# Patient Record
Sex: Female | Born: 1960 | Race: White | Hispanic: No | Smoking: Former smoker
Health system: Southern US, Community
[De-identification: ages and names within clinical notes are randomized; demographics above are authoritative.]

## PROBLEM LIST (undated history)

## (undated) DIAGNOSIS — J45909 Unspecified asthma, uncomplicated: Secondary | ICD-10-CM

## (undated) DIAGNOSIS — G8929 Other chronic pain: Secondary | ICD-10-CM

## (undated) DIAGNOSIS — G4730 Sleep apnea, unspecified: Secondary | ICD-10-CM

## (undated) DIAGNOSIS — I10 Essential (primary) hypertension: Secondary | ICD-10-CM

## (undated) DIAGNOSIS — E041 Nontoxic single thyroid nodule: Secondary | ICD-10-CM

## (undated) DIAGNOSIS — J449 Chronic obstructive pulmonary disease, unspecified: Secondary | ICD-10-CM

## (undated) DIAGNOSIS — M549 Dorsalgia, unspecified: Secondary | ICD-10-CM

## (undated) HISTORY — PX: TONSILLECTOMY: SUR1361

## (undated) HISTORY — PX: ABDOMINAL HYSTERECTOMY: SHX81

## (undated) HISTORY — PX: APPENDECTOMY: SHX54

---

## 2012-10-20 ENCOUNTER — Emergency Department (HOSPITAL_BASED_OUTPATIENT_CLINIC_OR_DEPARTMENT_OTHER)
Admission: EM | Admit: 2012-10-20 | Discharge: 2012-10-20 | Disposition: A | Discharge status: Home / Self Care | Payer: Medicare Other | Attending: Emergency Medicine | Admitting: Emergency Medicine

## 2012-10-20 ENCOUNTER — Encounter (HOSPITAL_BASED_OUTPATIENT_CLINIC_OR_DEPARTMENT_OTHER): Payer: Self-pay | Admitting: *Deleted

## 2012-10-20 DIAGNOSIS — K047 Periapical abscess without sinus: Secondary | ICD-10-CM

## 2012-10-20 DIAGNOSIS — Z8639 Personal history of other endocrine, nutritional and metabolic disease: Secondary | ICD-10-CM | POA: Insufficient documentation

## 2012-10-20 DIAGNOSIS — Z79899 Other long term (current) drug therapy: Secondary | ICD-10-CM | POA: Insufficient documentation

## 2012-10-20 DIAGNOSIS — K044 Acute apical periodontitis of pulpal origin: Secondary | ICD-10-CM | POA: Insufficient documentation

## 2012-10-20 DIAGNOSIS — Z862 Personal history of diseases of the blood and blood-forming organs and certain disorders involving the immune mechanism: Secondary | ICD-10-CM | POA: Insufficient documentation

## 2012-10-20 DIAGNOSIS — J449 Chronic obstructive pulmonary disease, unspecified: Secondary | ICD-10-CM | POA: Insufficient documentation

## 2012-10-20 DIAGNOSIS — Z87891 Personal history of nicotine dependence: Secondary | ICD-10-CM | POA: Insufficient documentation

## 2012-10-20 DIAGNOSIS — I10 Essential (primary) hypertension: Secondary | ICD-10-CM | POA: Insufficient documentation

## 2012-10-20 DIAGNOSIS — J4489 Other specified chronic obstructive pulmonary disease: Secondary | ICD-10-CM | POA: Insufficient documentation

## 2012-10-20 DIAGNOSIS — Z8739 Personal history of other diseases of the musculoskeletal system and connective tissue: Secondary | ICD-10-CM | POA: Insufficient documentation

## 2012-10-20 HISTORY — DX: Essential (primary) hypertension: I10

## 2012-10-20 HISTORY — DX: Dorsalgia, unspecified: M54.9

## 2012-10-20 HISTORY — DX: Chronic obstructive pulmonary disease, unspecified: J44.9

## 2012-10-20 HISTORY — DX: Unspecified asthma, uncomplicated: J45.909

## 2012-10-20 HISTORY — DX: Nontoxic single thyroid nodule: E04.1

## 2012-10-20 HISTORY — DX: Other chronic pain: G89.29

## 2012-10-20 MED ORDER — HYDROCODONE-ACETAMINOPHEN 5-325 MG PO TABS
1.0000 | ORAL_TABLET | Freq: Once | ORAL | Status: AC
  Administered 2012-10-20: 1 via ORAL
  Filled 2012-10-20: qty 1

## 2012-10-20 MED ORDER — IBUPROFEN 200 MG PO TABS
600.0000 mg | ORAL_TABLET | Freq: Once | ORAL | Status: AC
  Administered 2012-10-20: 600 mg via ORAL
  Filled 2012-10-20: qty 1

## 2012-10-20 MED ORDER — DIPHENHYDRAMINE HCL 25 MG PO CAPS: 25.0000 mg | ORAL_CAPSULE | Freq: Four times a day (QID) | ORAL | Status: AC | PRN

## 2012-10-20 MED ORDER — AMOXICILLIN-POT CLAVULANATE 875-125 MG PO TABS: 1.0000 | ORAL_TABLET | Freq: Two times a day (BID) | ORAL | Status: AC

## 2012-10-20 MED ORDER — IBUPROFEN 400 MG PO TABS: 400.0000 mg | ORAL_TABLET | Freq: Four times a day (QID) | ORAL | Status: AC | PRN

## 2012-10-20 MED ORDER — AMOXICILLIN-POT CLAVULANATE 875-125 MG PO TABS
1.0000 | ORAL_TABLET | Freq: Once | ORAL | Status: AC
  Administered 2012-10-20: 1 via ORAL
  Filled 2012-10-20: qty 1

## 2012-10-20 MED ORDER — HYDROCODONE-ACETAMINOPHEN 5-325 MG PO TABS: 1.0000 | ORAL_TABLET | Freq: Four times a day (QID) | ORAL | Status: AC | PRN

## 2012-10-20 NOTE — ED Provider Notes (Signed)
History    This chart was scribed for Carol Kaplan, MD by Donne Anon, ED Scribe. This patient was seen in room MH08/MH08 and the patient's care was started at 1541.   CSN: [id number]  Arrival date & time 10/20/12  1450   First MD Initiated Contact with Patient 10/20/12 1541      Chief Complaint  Patient presents with  . Dental Pain     The history is provided by the patient. No language interpreter was used.   Carol Herrera is a 52 y.o. female who presents to the Emergency Department complaining of gradual onset, non-changing, constant top right gum pain which radiates to her cheeks which began when she woke up this morning. Pt wears dentures and she has not since removed them. She reports associated difficulty chewing. She denies sore throat, nausea, vomiting, fever, chills, recent trauma or any other pain. She denies any h/o dental infections or previous episodes. She has not tried any OTC pain medication.She has a h/o COPD, HTN, asthma, and thyroid cyst.  She does not currently have a dentist and sees her PCP for her denture care. She has had dentures since age 49.   Past Medical History  Diagnosis Date  . COPD (chronic obstructive pulmonary disease)   . Hypertension   . Asthma   . Thyroid cyst   . Back pain, chronic     Past Surgical History  Procedure Laterality Date  . Appendectomy    . Tonsillectomy    . Abdominal hysterectomy      No family history on file.  History  Substance Use Topics  . Smoking status: Former Games developer  . Smokeless tobacco: Never Used  . Alcohol Use: No    Review of Systems  Constitutional: Negative for chills.  HENT: Positive for dental problem.   Gastrointestinal: Negative for nausea and vomiting.  All other systems reviewed and are negative.    Allergies  Biaxin; Stadol; Penicillins; and Sulfa antibiotics  Home Medications   Current Outpatient Rx  Name  Route  Sig  Dispense  Refill  . albuterol (PROVENTIL HFA;VENTOLIN HFA) 108  (90 BASE) MCG/ACT inhaler   Inhalation   Inhale 2 puffs into the lungs 2 (two) times daily.         Marland Kitchen albuterol-ipratropium (COMBIVENT) 18-103 MCG/ACT inhaler   Inhalation   Inhale 2 puffs into the lungs 2 (two) times daily.         Marland Kitchen amLODipine (NORVASC) 10 MG tablet   Oral   Take 10 mg by mouth daily.         Marland Kitchen ipratropium-albuterol (DUONEB) 0.5-2.5 (3) MG/3ML SOLN   Nebulization   Take 3 mLs by nebulization every 4 (four) hours as needed.         Marland Kitchen omeprazole (PRILOSEC) 40 MG capsule   Oral   Take 40 mg by mouth daily.         . sertraline (ZOLOFT) 100 MG tablet   Oral   Take 50 mg by mouth daily.         . zafirlukast (ACCOLATE) 20 MG tablet   Oral   Take 20 mg by mouth 2 (two) times daily.           BP 107/70  Pulse 101  Temp(Src) 97.9 F (36.6 C) (Oral)  Resp 20  Ht 5\' 4"  (1.626 m)  Wt 260 lb (117.935 kg)  BMI 44.61 kg/m2  SpO2 93%  Physical Exam  Nursing note and vitals reviewed. Constitutional: She  is oriented to person, place, and time. She appears well-developed and well-nourished. No distress.  HENT:  Head: Normocephalic and atraumatic.  Mouth/Throat: No oropharyngeal exudate or posterior oropharyngeal erythema.  Mucosa is clean. Tenderness to palpation from tooth 1-8. Tenderness is worse to tooth 5-8. No lymphadenopathy. Tenderness is reproducible with pappatation of dentures. Dentures are planted for teeth 1-16. Between tooth 5-7 there is what appears to be a cyst, no erythema appreciated. No fluctuance appreciated.  Eyes: EOM are normal.  Neck: Neck supple. No tracheal deviation present.  Cardiovascular: Normal rate, regular rhythm and normal heart sounds.   Pulmonary/Chest: Effort normal and breath sounds normal. No respiratory distress.  Musculoskeletal: Normal range of motion.  Neurological: She is alert and oriented to person, place, and time.  Skin: Skin is warm and dry.  Psychiatric: She has a normal mood and affect. Her behavior  is normal.    ED Course  Procedures (including critical care time) DIAGNOSTIC STUDIES: Oxygen Saturation is 93% on room air, low by my interpretation.    COORDINATION OF CARE: 4:18 PM Discussed treatment plan which includes pain medication, antibiotics and a follow up with a dentist with pt at bedside and pt agreed to plan.     Labs Reviewed - No data to display No results found.   No diagnosis found.    MDM  I personally performed the services described in this documentation, which was scribed in my presence. The recorded information has been reviewed and is accurate.  Pt comes in with cc of tooth pain. She has a specific area - between teeth 5-7 - where there is a cystic lesion, and it is very tender. The lesion is not new. There is no fluctuance. No n/v/f/c. No trismus.  No visible abscess.  I have requested patient to take her dentures out. She is to see a dentist soon - and i have explained to her the importance of close followup. AB to be started in the ED.       Carol Kaplan, MD 10/20/12 [phone]

## 2012-10-20 NOTE — ED Notes (Signed)
Mouth swollen x 1 day- states woke with symptoms

## 2018-07-26 ENCOUNTER — Emergency Department (HOSPITAL_BASED_OUTPATIENT_CLINIC_OR_DEPARTMENT_OTHER)
Admission: EM | Admit: 2018-07-26 | Discharge: 2018-07-26 | Disposition: A | Discharge status: Other Acute Inpatient Hospital | Payer: Medicare HMO | Attending: Emergency Medicine | Admitting: Emergency Medicine

## 2018-07-26 ENCOUNTER — Emergency Department (HOSPITAL_BASED_OUTPATIENT_CLINIC_OR_DEPARTMENT_OTHER): Payer: Medicare HMO

## 2018-07-26 ENCOUNTER — Encounter (HOSPITAL_BASED_OUTPATIENT_CLINIC_OR_DEPARTMENT_OTHER): Payer: Self-pay | Admitting: Emergency Medicine

## 2018-07-26 ENCOUNTER — Other Ambulatory Visit: Payer: Self-pay

## 2018-07-26 DIAGNOSIS — Z87891 Personal history of nicotine dependence: Secondary | ICD-10-CM | POA: Diagnosis not present

## 2018-07-26 DIAGNOSIS — R05 Cough: Secondary | ICD-10-CM | POA: Diagnosis present

## 2018-07-26 DIAGNOSIS — Z79899 Other long term (current) drug therapy: Secondary | ICD-10-CM | POA: Diagnosis not present

## 2018-07-26 DIAGNOSIS — J189 Pneumonia, unspecified organism: Secondary | ICD-10-CM | POA: Diagnosis not present

## 2018-07-26 DIAGNOSIS — R0789 Other chest pain: Secondary | ICD-10-CM | POA: Insufficient documentation

## 2018-07-26 DIAGNOSIS — J441 Chronic obstructive pulmonary disease with (acute) exacerbation: Secondary | ICD-10-CM | POA: Diagnosis not present

## 2018-07-26 DIAGNOSIS — J45909 Unspecified asthma, uncomplicated: Secondary | ICD-10-CM | POA: Diagnosis not present

## 2018-07-26 DIAGNOSIS — J181 Lobar pneumonia, unspecified organism: Secondary | ICD-10-CM

## 2018-07-26 DIAGNOSIS — I10 Essential (primary) hypertension: Secondary | ICD-10-CM | POA: Insufficient documentation

## 2018-07-26 HISTORY — DX: Sleep apnea, unspecified: G47.30

## 2018-07-26 LAB — CBC WITH DIFFERENTIAL/PLATELET
Abs Immature Granulocytes: 0.02 10*3/uL (ref 0.00–0.07)
Basophils Absolute: 0.1 10*3/uL (ref 0.0–0.1)
Basophils Relative: 1 %
EOS PCT: 3 %
Eosinophils Absolute: 0.2 10*3/uL (ref 0.0–0.5)
HEMATOCRIT: 33.6 % — AB (ref 36.0–46.0)
HEMOGLOBIN: 9.6 g/dL — AB (ref 12.0–15.0)
Immature Granulocytes: 0 %
LYMPHS ABS: 2.8 10*3/uL (ref 0.7–4.0)
LYMPHS PCT: 35 %
MCH: 20.7 pg — AB (ref 26.0–34.0)
MCHC: 28.6 g/dL — AB (ref 30.0–36.0)
MCV: 72.4 fL — ABNORMAL LOW (ref 80.0–100.0)
Monocytes Absolute: 0.9 10*3/uL (ref 0.1–1.0)
Monocytes Relative: 11 %
Neutro Abs: 4.2 10*3/uL (ref 1.7–7.7)
Neutrophils Relative %: 50 %
Platelets: 220 10*3/uL (ref 150–400)
RBC: 4.64 MIL/uL (ref 3.87–5.11)
RDW: 17.3 % — ABNORMAL HIGH (ref 11.5–15.5)
WBC: 8.2 10*3/uL (ref 4.0–10.5)
nRBC: 0 % (ref 0.0–0.2)

## 2018-07-26 LAB — BASIC METABOLIC PANEL
Anion gap: 8 (ref 5–15)
BUN: 15 mg/dL (ref 6–20)
CHLORIDE: 104 mmol/L (ref 98–111)
CO2: 24 mmol/L (ref 22–32)
CREATININE: 1.08 mg/dL — AB (ref 0.44–1.00)
Calcium: 8.9 mg/dL (ref 8.9–10.3)
GFR calc Af Amer: >60 mL/min (ref >60)
GFR calc non Af Amer: 57 mL/min — ABNORMAL LOW (ref >60)
GLUCOSE: 91 mg/dL (ref 70–99)
Potassium: 3.5 mmol/L (ref 3.5–5.1)
Sodium: 136 mmol/L (ref 135–145)

## 2018-07-26 LAB — TROPONIN I

## 2018-07-26 MED ORDER — ALBUTEROL SULFATE (2.5 MG/3ML) 0.083% IN NEBU
5.0000 mg | INHALATION_SOLUTION | Freq: Once | RESPIRATORY_TRACT | Status: AC
  Administered 2018-07-26: 5 mg via RESPIRATORY_TRACT
  Filled 2018-07-26: qty 6

## 2018-07-26 MED ORDER — LEVOFLOXACIN IN D5W 750 MG/150ML IV SOLN
750.0000 mg | Freq: Once | INTRAVENOUS | Status: AC
Start: 2018-07-26 — End: 2018-07-26
  Administered 2018-07-26: 750 mg via INTRAVENOUS
  Filled 2018-07-26: qty 150

## 2018-07-26 MED ORDER — METHYLPREDNISOLONE SODIUM SUCC 125 MG IJ SOLR
125.0000 mg | Freq: Once | INTRAMUSCULAR | Status: AC
  Administered 2018-07-26: 125 mg via INTRAVENOUS
  Filled 2018-07-26: qty 2

## 2018-07-26 MED ORDER — IPRATROPIUM BROMIDE 0.02 % IN SOLN
0.5000 mg | Freq: Once | RESPIRATORY_TRACT | Status: AC
  Administered 2018-07-26: 0.5 mg via RESPIRATORY_TRACT
  Filled 2018-07-26: qty 2.5

## 2018-07-26 NOTE — ED Notes (Signed)
Pt and family made aware that pt has been accepted at [facility]. Awaiting bed assignment at this time.

## 2018-07-26 NOTE — ED Notes (Signed)
Bedside commode provided for patient at this time

## 2018-07-26 NOTE — ED Notes (Signed)
Transport en route to pick up patient

## 2018-07-26 NOTE — ED Notes (Signed)
[facility] here with patient at this time. Leaving with patient

## 2018-07-26 NOTE — ED Provider Notes (Signed)
TIME SEEN: 12:51 AM  CHIEF COMPLAINT: Cough, shortness of breath  HPI: Patient is a 57 year old female with history of hypertension, COPD on home oxygen at night who presents to the emergency department with 2 days of cough with yellow sputum production, shortness of breath.  Just recently admitted to the hospital for multifocal pneumonia.  She denies any fever.  Having some chest discomfort with coughing.  She is concerned that she could have pneumonia again.  ROS: See HPI Constitutional: no fever  Eyes: no drainage  ENT: no runny nose   Cardiovascular:  chest pain  Resp:  SOB  GI: no vomiting GU: no dysuria Integumentary: no rash  Allergy: no hives  Musculoskeletal: no leg swelling  Neurological: no slurred speech ROS otherwise negative  PAST MEDICAL HISTORY/PAST SURGICAL HISTORY:  Past Medical History:  Diagnosis Date  . Asthma   . Back pain, chronic   . COPD (chronic obstructive pulmonary disease) (HCC)   . Hypertension   . Thyroid cyst     MEDICATIONS:  Prior to Admission medications   Medication Sig Start Date End Date Taking? Authorizing Provider  albuterol (PROVENTIL HFA;VENTOLIN HFA) 108 (90 BASE) MCG/ACT inhaler Inhale 2 puffs into the lungs 2 (two) times daily.    [provider]  albuterol-ipratropium (COMBIVENT) 18-103 MCG/ACT inhaler Inhale 2 puffs into the lungs 2 (two) times daily.    [provider]  amLODipine (NORVASC) 10 MG tablet Take 10 mg by mouth daily.    [provider]  amoxicillin-clavulanate (AUGMENTIN) 875-125 MG per tablet Take 1 tablet by mouth 2 (two) times daily. 10/21/12   [name], MD  diphenhydrAMINE (BENADRYL) 25 mg capsule Take 1 capsule (25 mg total) by mouth every 6 (six) hours as needed for itching. 10/20/12   [name], MD  HYDROcodone-acetaminophen (NORCO/VICODIN) 5-325 MG per tablet Take 1 tablet by mouth every 6 (six) hours as needed for pain. 10/20/12   [name], MD  ibuprofen  (ADVIL,MOTRIN) 400 MG tablet Take 1 tablet (400 mg total) by mouth every 6 (six) hours as needed for pain. 10/20/12   [name], MD  ipratropium-albuterol (DUONEB) 0.5-2.5 (3) MG/3ML SOLN Take 3 mLs by nebulization every 4 (four) hours as needed.    [provider]  omeprazole (PRILOSEC) 40 MG capsule Take 40 mg by mouth daily.    [provider]  sertraline (ZOLOFT) 100 MG tablet Take 50 mg by mouth daily.    [provider]  zafirlukast (ACCOLATE) 20 MG tablet Take 20 mg by mouth 2 (two) times daily.    [provider]    ALLERGIES:  Allergies  Allergen Reactions  . Biaxin [Clarithromycin] Other (See Comments)    "made me real tired"  . Stadol [Butorphanol] Other (See Comments)    "heart slows down"  . Penicillins Rash  . Sulfa Antibiotics Rash    SOCIAL HISTORY:  Social History   Tobacco Use  . Smoking status: Former Games developer  . Smokeless tobacco: Never Used  Substance Use Topics  . Alcohol use: No    FAMILY HISTORY: No family history on file.  EXAM: BP (!) 152/85 (BP Location: Right Arm)   Pulse 83   Temp 98.2 F (36.8 C) (Oral)   Resp (!) 21   Ht 5\' 2"  (1.575 m)   Wt 113.4 kg   SpO2 95%   BMI 45.73 kg/m  CONSTITUTIONAL: Alert and oriented and responds appropriately to questions.  Chronically ill-appearing, obese HEAD: Normocephalic EYES: Conjunctivae clear, pupils appear  equal, EOMI ENT: normal nose; moist mucous membranes NECK: Supple, no meningismus, no nuchal rigidity, no LAD  CARD: RRR; S1 and S2 appreciated; no murmurs, no clicks, no rubs, no gallops RESP: Diminished aeration at bases bilaterally, scattered inspiratory and expiratory wheezes, no rhonchi or rales, no hypoxia, no increased work of breathing, speaking full sentences, no distress ABD/GI: Normal bowel sounds; non-distended; soft, non-tender, no rebound, no guarding, no peritoneal signs, no hepatosplenomegaly BACK:  The back appears normal and is non-tender  to palpation, there is no CVA tenderness EXT: Normal ROM in all joints; non-tender to palpation; no edema; normal capillary refill; no cyanosis, no calf tenderness or swelling    SKIN: Normal color for age and race; warm; no rash NEURO: Moves all extremities equally PSYCH: The patient's mood and manner are appropriate. Grooming and personal hygiene are appropriate.  MEDICAL DECISION MAKING: Patient here with chest congestion, chest discomfort, shortness of breath over the past couple of days.  She is concerned that she has pneumonia again.  She does appear to be having a COPD exacerbation currently.  Will give albuterol, Atrovent, Solu-Medrol.  Chest x-Ferrera pending.  EKG shows right bundle branch block.  ED PROGRESS: Chest x-[place] concerning for right mid/upper lung pneumonia.  Will give IV Levaquin.  Will obtain labs, cultures.  Patient states that every time she has pneumonia she is admitted to the hospital and she does not feel comfortable with a plan for discharge home on oral antibiotics.  She would like to be admitted to [facility].  Her PCP is with [facility].  2:11 AM  D/w Dr. Clint GuyHower, hospitalist at [facility].  He agrees to accept patient to an observation bed.  She will be transferred once bed available.  Patient hemodynamically stable.   I reviewed all nursing notes, vitals, pertinent previous records, EKGs, lab and urine results, imaging (as available).    EKG Interpretation  Date/Time:  Saturday July 26 2018 00:53:37 EST Ventricular Rate:  94 PR Interval:    QRS Duration: 129 QT Interval:  365 QTC Calculation: 457 R Axis:   78 Text Interpretation:  Sinus rhythm Right bundle branch block No old tracing to compare Confirmed by Devine Klingel, Baxter HireKristen [phone]([id number]) on 07/26/2018 1:05:49 AM         Tomaz Janis, Layla MawKristen N, DO 07/26/18 [id number]

## 2018-07-26 NOTE — Progress Notes (Signed)
Placed patient on a 3 liter nasal cannula per her home regimen.  Patient stated she wears a 3 liters of O2 at night at home.  RT will continue to monitor.

## 2018-07-26 NOTE — ED Triage Notes (Signed)
Pt c/o SOB for the past 2 days, states she has hx of COPD and was admitted no long ago for PNA.

## 2018-07-26 NOTE — ED Notes (Signed)
Consult made to hospitalist for [facility]

## 2018-07-26 NOTE — ED Notes (Signed)
Report called Rene Kocheregina, Charity fundraiserN at [facility]

## 2018-07-31 LAB — CULTURE, BLOOD (ROUTINE X 2)
CULTURE: NO GROWTH
Culture: NO GROWTH
Special Requests: ADEQUATE

## 2019-11-28 IMAGING — CR DG CHEST 2V
2 series · 2 of 2 positions shown · non-contrast
Comparison: Radiograph 05/09/2018 at [REDACTED]. Chest CT
04/04/2018

CLINICAL DATA: Shortness of breath for 2 days.

EXAM:
CHEST - 2 VIEW

[w chest pa]
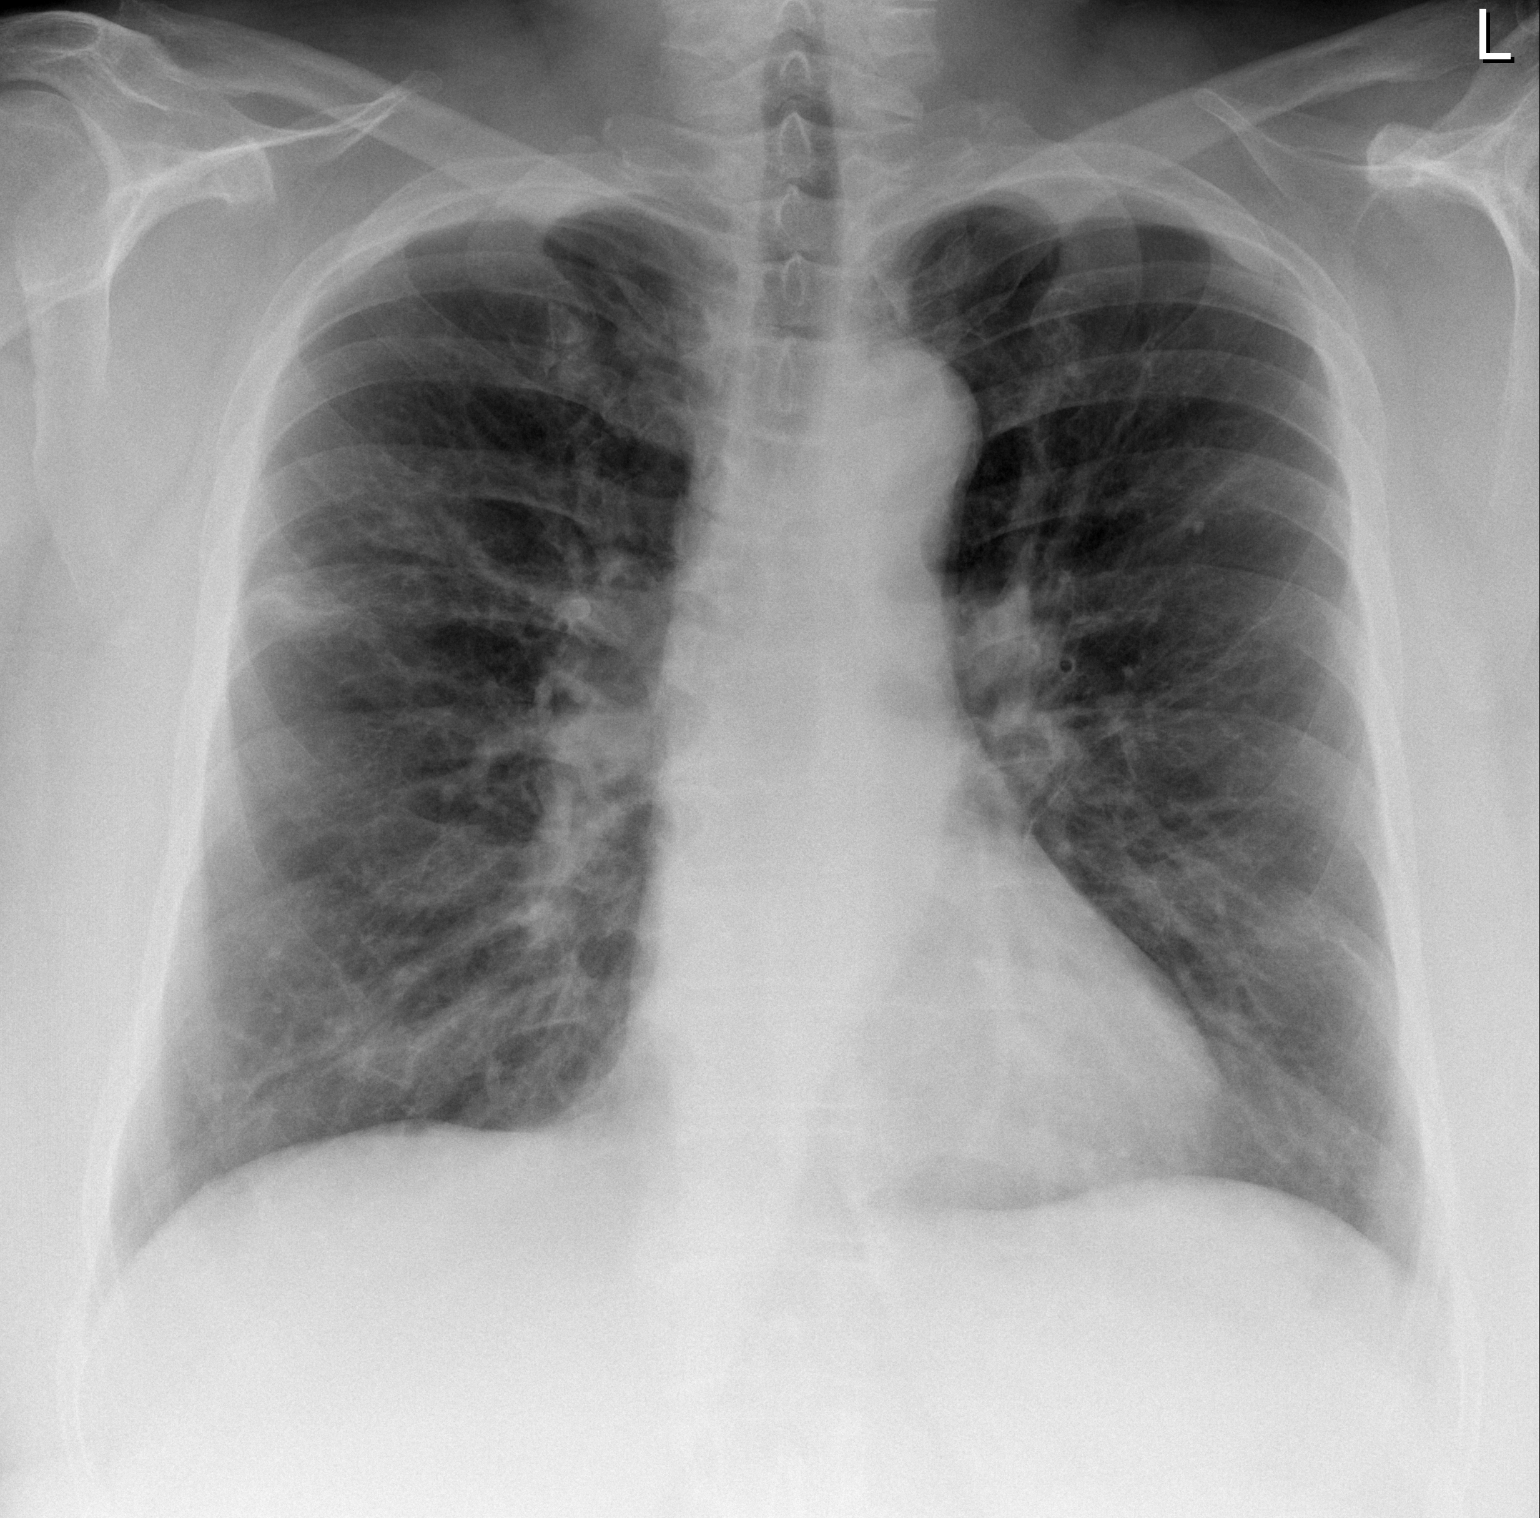

[w chest lat]
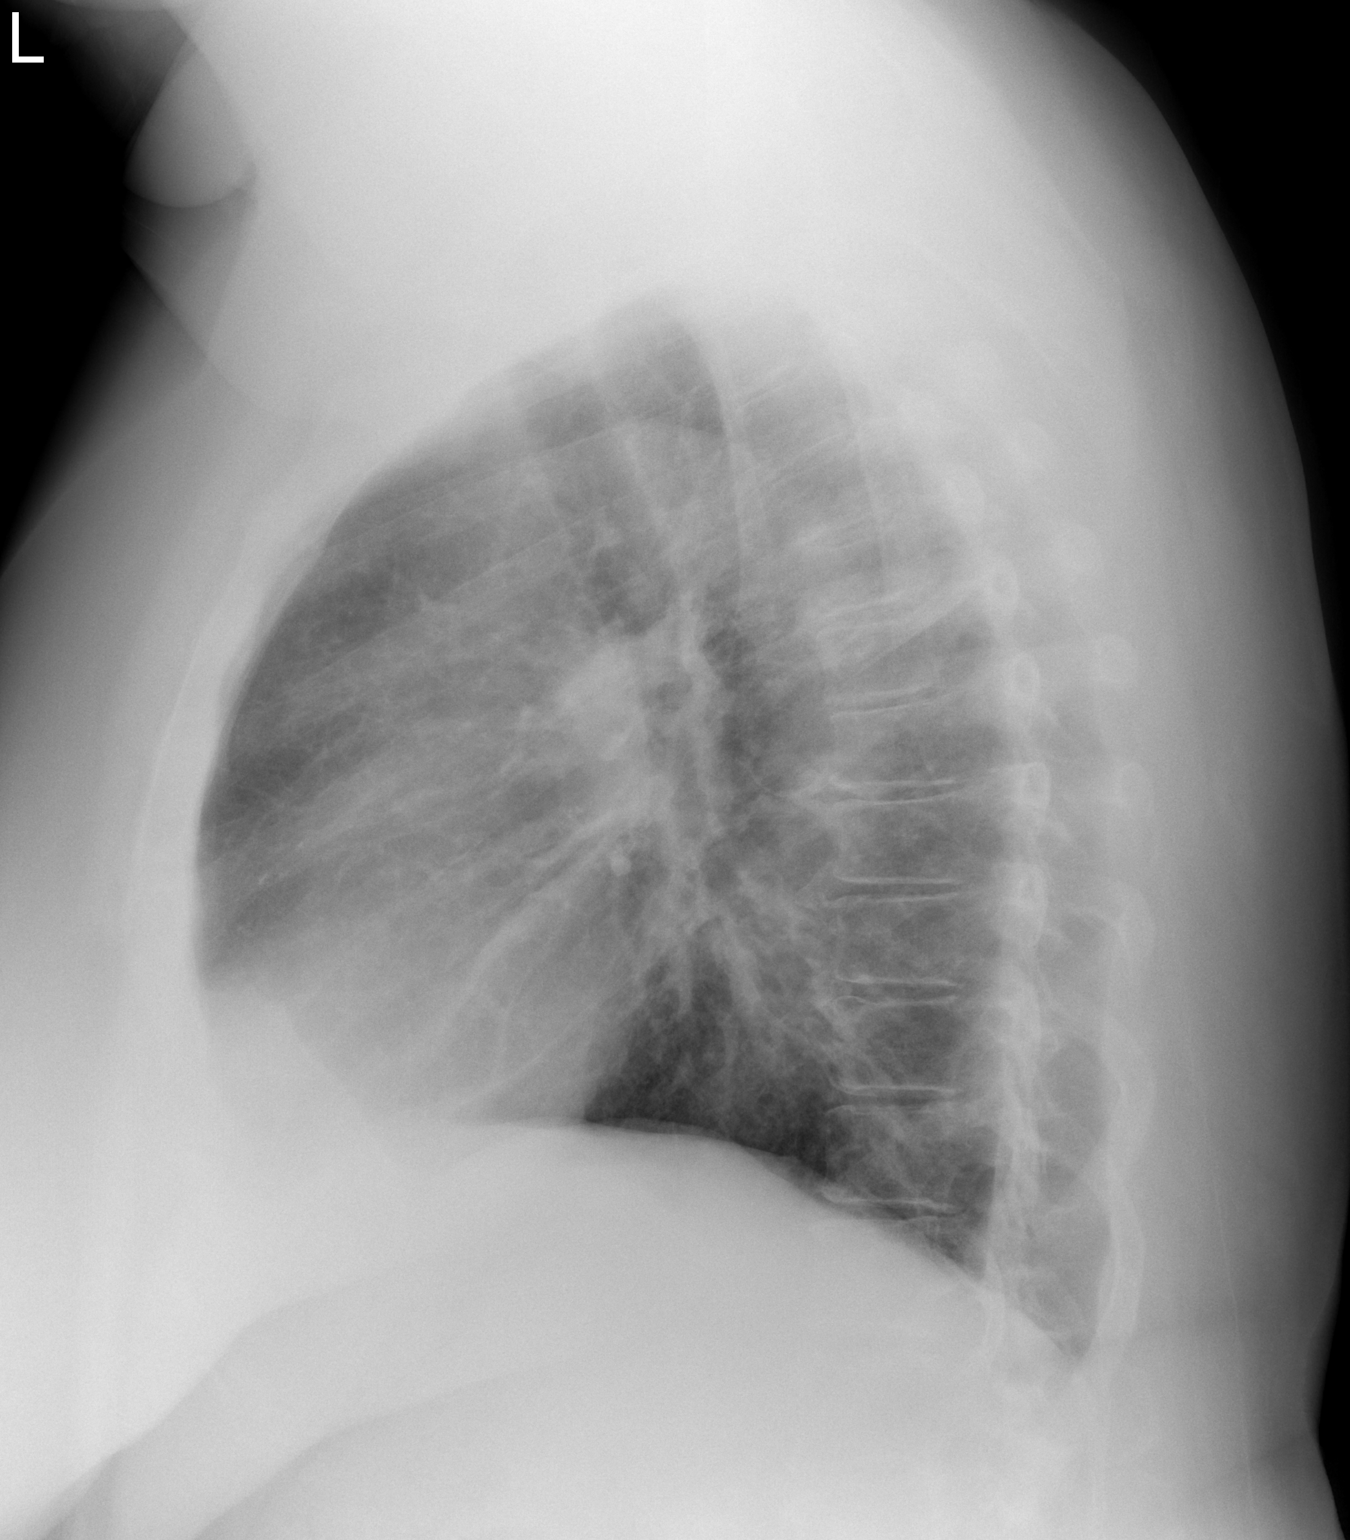

[2 of 2 positions shown; findings below may reference images not displayed]

FINDINGS: Chronic hyperinflation and bronchial thickening. New peripheral
opacity in the right mid/upper lung zone. Unchanged heart size and
mediastinal contours. No pleural fluid or pneumothorax. No pulmonary
edema. No acute osseous abnormalities.
IMPRESSION: Peripheral opacity in the right mid/upper lung zone suspicious for
pneumonia. Chronic bronchial thickening and hyperinflation
consistent with COPD. Followup PA and lateral chest X-ray is
recommended in 3-4 weeks following trial of antibiotic therapy to
ensure resolution and exclude underlying malignancy.

## 2023-11-24 ENCOUNTER — Encounter (HOSPITAL_COMMUNITY): Payer: Self-pay | Admitting: Emergency Medicine

## 2023-11-24 ENCOUNTER — Emergency Department (HOSPITAL_COMMUNITY)

## 2023-11-24 ENCOUNTER — Emergency Department (HOSPITAL_COMMUNITY)
Admission: EM | Admit: 2023-11-24 | Discharge: 2023-11-24 | Disposition: A | Discharge status: Home / Self Care | Attending: Emergency Medicine | Admitting: Emergency Medicine

## 2023-11-24 DIAGNOSIS — R000 Tachycardia, unspecified: Secondary | ICD-10-CM | POA: Diagnosis not present

## 2023-11-24 DIAGNOSIS — Z87891 Personal history of nicotine dependence: Secondary | ICD-10-CM | POA: Insufficient documentation

## 2023-11-24 DIAGNOSIS — Z79899 Other long term (current) drug therapy: Secondary | ICD-10-CM | POA: Diagnosis not present

## 2023-11-24 DIAGNOSIS — J449 Chronic obstructive pulmonary disease, unspecified: Secondary | ICD-10-CM | POA: Diagnosis not present

## 2023-11-24 DIAGNOSIS — R0602 Shortness of breath: Secondary | ICD-10-CM | POA: Diagnosis not present

## 2023-11-24 DIAGNOSIS — R600 Localized edema: Secondary | ICD-10-CM | POA: Insufficient documentation

## 2023-11-24 DIAGNOSIS — R072 Precordial pain: Secondary | ICD-10-CM | POA: Insufficient documentation

## 2023-11-24 DIAGNOSIS — R0789 Other chest pain: Secondary | ICD-10-CM

## 2023-11-24 DIAGNOSIS — I10 Essential (primary) hypertension: Secondary | ICD-10-CM | POA: Insufficient documentation

## 2023-11-24 LAB — BASIC METABOLIC PANEL WITH GFR
Anion gap: 6 (ref 5–15)
BUN: 8 mg/dL (ref 8–23)
CO2: 28 mmol/L (ref 22–32)
Calcium: 8.8 mg/dL — ABNORMAL LOW (ref 8.9–10.3)
Chloride: 105 mmol/L (ref 98–111)
Creatinine, Ser: 1.04 mg/dL — ABNORMAL HIGH (ref 0.44–1.00)
GFR, Estimated: >60 mL/min (ref >60)
Glucose, Bld: 78 mg/dL (ref 70–99)
Potassium: 4 mmol/L (ref 3.5–5.1)
Sodium: 139 mmol/L (ref 135–145)

## 2023-11-24 LAB — D-DIMER, QUANTITATIVE: D-Dimer, Quant: 0.94 ug{FEU}/mL — ABNORMAL HIGH (ref 0.00–0.50)

## 2023-11-24 LAB — CBC
HCT: 34.5 % — ABNORMAL LOW (ref 36.0–46.0)
Hemoglobin: 9.2 g/dL — ABNORMAL LOW (ref 12.0–15.0)
MCH: 19.7 pg — ABNORMAL LOW (ref 26.0–34.0)
MCHC: 26.7 g/dL — ABNORMAL LOW (ref 30.0–36.0)
MCV: 74 fL — ABNORMAL LOW (ref 80.0–100.0)
Platelets: 118 10*3/uL — ABNORMAL LOW (ref 150–400)
RBC: 4.66 MIL/uL (ref 3.87–5.11)
RDW: 19.1 % — ABNORMAL HIGH (ref 11.5–15.5)
WBC: 8.4 10*3/uL (ref 4.0–10.5)
nRBC: 0 % (ref 0.0–0.2)

## 2023-11-24 LAB — TROPONIN I (HIGH SENSITIVITY)
Troponin I (High Sensitivity): 7 ng/L (ref <18)
Troponin I (High Sensitivity): 7 ng/L (ref <18)

## 2023-11-24 MED ORDER — ONDANSETRON HCL 4 MG/2ML IJ SOLN
4.0000 mg | Freq: Once | INTRAMUSCULAR | Status: AC
  Administered 2023-11-24: 4 mg via INTRAVENOUS
  Filled 2023-11-24: qty 2

## 2023-11-24 MED ORDER — IOHEXOL 350 MG/ML SOLN
75.0000 mL | Freq: Once | INTRAVENOUS | Status: AC | PRN
  Administered 2023-11-24: 75 mL via INTRAVENOUS

## 2023-11-24 MED ORDER — MORPHINE SULFATE (PF) 4 MG/ML IV SOLN
4.0000 mg | Freq: Once | INTRAVENOUS | Status: AC
  Administered 2023-11-24: 4 mg via INTRAVENOUS
  Filled 2023-11-24: qty 1

## 2023-11-24 NOTE — ED Notes (Addendum)
 Pt still c/o chest pain; provider was made aware. Pt states the pain is the same as when she arrived and had not gotten worse. Pt also states she is comfortable going home and ready to go. Pt encouraged to stay and let another provider assess her. Pt insist on leaving and going home.

## 2023-11-24 NOTE — Discharge Instructions (Signed)
 We evaluated you for your chest pain.  Your testing in the emergency department is reassuring.  Your cardiac enzyme testing was negative.  We also evaluated you for a blood clot in your lung.  This test was also negative.  We did notice that your airways were a little bit smaller than normal.  We discussed this with the lung doctor, and they recommend proceeding with your outpatient sleep study and following up with your outpatient lung doctor.  We have also referred you to cardiology.  They should call you in the next 48 to 72 hours to help schedule a follow-up appointment.  If you develop any new or worsening symptoms such as recurrent pain, difficulty breathing, lightheadedness or dizziness, fainting, fevers or chills, cough, or any other new symptoms, please return to the emergency department for reassessment.

## 2023-11-24 NOTE — ED Triage Notes (Signed)
 Pt here riding in the car and developed some chest pain radiating down her left arm , along with some slight nausea and sob , on 4 liters n/c otc

## 2023-11-24 NOTE — ED Provider Notes (Signed)
 [place] EMERGENCY DEPARTMENT AT [facility] Provider Note  CSN: [id number] Arrival date & time: 11/24/23 1743  Chief Complaint(s) No chief complaint on file.  HPI Carol Herrera is a 63 y.o. female history of COPD, hypertension presenting to the emergency department chest pain.  Patient reports that she developed chest pain starting today, while riding in the car, just prior to arrival.  She reports that she is on 4 L of oxygen at home.  She also reports some shortness of breath.  No fevers, chills.  She reports nausea, no vomiting.  No modifying factors to her chest pain.  Chest pain located substernally.  No lightheadedness or dizziness.  Denies similar episode previously.  No recent travel or surgeries.   Past Medical History Past Medical History:  Diagnosis Date   Asthma    Back pain, chronic    COPD (chronic obstructive pulmonary disease) (HCC)    Hypertension    Sleep apnea    Thyroid cyst    There are no active problems to display for this patient.  Home Medication(s) Prior to Admission medications   Medication Sig Start Date End Date Taking? Authorizing Provider  albuterol (PROVENTIL HFA;VENTOLIN HFA) 108 (90 BASE) MCG/ACT inhaler Inhale 2 puffs into the lungs 2 (two) times daily.    [provider]  albuterol-ipratropium (COMBIVENT) 18-103 MCG/ACT inhaler Inhale 2 puffs into the lungs 2 (two) times daily.    [provider]  amLODipine (NORVASC) 10 MG tablet Take 10 mg by mouth daily.    [provider]  amoxicillin-clavulanate (AUGMENTIN) 875-125 MG per tablet Take 1 tablet by mouth 2 (two) times daily. 10/21/12   [name], MD  diphenhydrAMINE (BENADRYL) 25 mg capsule Take 1 capsule (25 mg total) by mouth every 6 (six) hours as needed for itching. 10/20/12   [name], MD  HYDROcodone-acetaminophen (NORCO/VICODIN) 5-325 MG per tablet Take 1 tablet by mouth every 6 (six) hours as needed for pain. 10/20/12   [name], MD   ibuprofen (ADVIL,MOTRIN) 400 MG tablet Take 1 tablet (400 mg total) by mouth every 6 (six) hours as needed for pain. 10/20/12   [name], MD  ipratropium-albuterol (DUONEB) 0.5-2.5 (3) MG/3ML SOLN Take 3 mLs by nebulization every 4 (four) hours as needed.    [provider]  omeprazole (PRILOSEC) 40 MG capsule Take 40 mg by mouth daily.    [provider]  sertraline (ZOLOFT) 100 MG tablet Take 50 mg by mouth daily.    [provider]  zafirlukast (ACCOLATE) 20 MG tablet Take 20 mg by mouth 2 (two) times daily.    [provider]                                                                                                                                    Past Surgical History Past Surgical History:  Procedure Laterality Date   ABDOMINAL HYSTERECTOMY  APPENDECTOMY     TONSILLECTOMY     Family History History reviewed. No pertinent family history.  Social History Social History   Tobacco Use   Smoking status: Former   Smokeless tobacco: Never  Substance Use Topics   Alcohol use: No   Drug use: No   Allergies Biaxin [clarithromycin], Stadol [butorphanol], Penicillins, and Sulfa antibiotics  Review of Systems Review of Systems  All other systems reviewed and are negative.   Physical Exam Vital Signs  I have reviewed the triage vital signs BP 130/81   Resp (!) 28   SpO2 96%  Physical Exam Vitals and nursing note reviewed.  Constitutional:      General: She is not in acute distress.    Appearance: She is well-developed.  HENT:     Head: Normocephalic and atraumatic.     Mouth/Throat:     Mouth: Mucous membranes are moist.  Eyes:     Pupils: Pupils are equal, round, and reactive to light.  Neck:     Vascular: No JVD.  Cardiovascular:     Rate and Rhythm: Regular rhythm. Tachycardia present.     Pulses:          Radial pulses are 2+ on the right side and 2+ on the left side.     Heart sounds: No murmur  heard. Pulmonary:     Effort: Pulmonary effort is normal. No respiratory distress.     Breath sounds: Normal breath sounds.  Abdominal:     General: Abdomen is flat.     Palpations: Abdomen is soft.     Tenderness: There is no abdominal tenderness.  Musculoskeletal:        General: No tenderness.     Right lower leg: Edema present.     Left lower leg: Edema present.     Comments: Only trace edema  Skin:    General: Skin is warm and dry.  Neurological:     General: No focal deficit present.     Mental Status: She is alert. Mental status is at baseline.  Psychiatric:        Mood and Affect: Mood normal.        Behavior: Behavior normal.     ED Results and Treatments Labs (all labs ordered are listed, but only abnormal results are displayed) Labs Reviewed  BASIC METABOLIC PANEL WITH GFR  CBC  D-DIMER, QUANTITATIVE  TROPONIN I (HIGH SENSITIVITY)                                                                                                                          Radiology No results found.  Pertinent labs & imaging results that were available during my care of the patient were reviewed by me and considered in my medical decision making (see MDM for details).  Medications Ordered in ED Medications  morphine (PF) 4 MG/ML injection 4 mg (has no administration in time range)  ondansetron (ZOFRAN) injection 4 mg (has no administration  in time range)                                                                                                                                     Procedures Procedures  (including critical care time)  Medical Decision Making / ED Course   MDM:  63 year old presenting to the emergency department with chest pain.  Patient overall well-appearing, vital signs with mild tachycardia, tachypnea.  Not hypoxic on her home oxygen.  Her lungs are clear on exam.  No crackles, no wheezing.  Unclear cause of her chest pain, EKG without acute  change from prior, will check troponin x 2 given recent onset of symptoms.  Given associated dyspnea, mild tachycardia, will check D-dimer to evaluate for pulmonary embolism but lower concern for this.  Low concern for pneumothorax or pneumonia but will check chest x-Coffie.  Doubt dissection with equal pulses but will check chest x-Wholey to evaluate for mediastinal widening and will check D-dimer.  Will reassess.  Will treat her pain.  If workup is reassuring, patient feeling better, possibly discharged home      Additional history obtained: -Additional history obtained from {wsadditionalhistorian:28072} -External records from outside source obtained and reviewed including: Chart review including previous notes, labs, imaging, consultation notes including ***   Lab Tests: -I ordered, reviewed, and interpreted labs.   The pertinent results include:   Labs Reviewed  BASIC METABOLIC PANEL WITH GFR  CBC  D-DIMER, QUANTITATIVE  TROPONIN I (HIGH SENSITIVITY)    Notable for ***  EKG   EKG Interpretation Date/Time:  Sunday November 24 2023 17:47:02 EDT Ventricular Rate:  95 PR Interval:  156 QRS Duration:  126 QT Interval:  374 QTC Calculation: 469 R Axis:   73  Text Interpretation: Normal sinus rhythm Right bundle branch block Septal infarct , age undetermined Abnormal ECG When compared with ECG of 26-Jul-2018 00:53, No significant change since last tracing Confirmed by Alvino Blood ([id number]) on 11/24/2023 6:09:30 PM         Imaging Studies ordered: I ordered imaging studies including *** On my interpretation imaging demonstrates *** I independently visualized and interpreted imaging. I agree with the radiologist interpretation   Medicines ordered and prescription drug management: Meds ordered this encounter  Medications   morphine (PF) 4 MG/ML injection 4 mg   ondansetron (ZOFRAN) injection 4 mg    -I have reviewed the patients home medicines and have made adjustments as  needed   Consultations Obtained: I requested consultation with the ***,  and discussed lab and imaging findings as well as pertinent plan - they recommend: ***   Cardiac Monitoring: The patient was maintained on a cardiac monitor.  I personally viewed and interpreted the cardiac monitored which showed an underlying rhythm of: ***  Social Determinants of Health:  Diagnosis or treatment significantly limited by social determinants of health: {wssoc:28071}   Reevaluation: After the interventions noted above, I reevaluated  the patient and found that their symptoms have {resolved/improved/worsened:23923::"improved"}  Co morbidities that complicate the patient evaluation  Past Medical History:  Diagnosis Date   Asthma    Back pain, chronic    COPD (chronic obstructive pulmonary disease) (HCC)    Hypertension    Sleep apnea    Thyroid cyst       Dispostion: Disposition decision including need for hospitalization was considered, and patient {wsdispo:28070::"discharged from emergency department."}    Final Clinical Impression(s) / ED Diagnoses Final diagnoses:  None     This chart was dictated using voice recognition software.  Despite best efforts to proofread,  errors can occur which can change the documentation meaning.

## 2023-12-16 ENCOUNTER — Ambulatory Visit: Attending: Cardiology | Admitting: Cardiology

## 2023-12-17 ENCOUNTER — Encounter: Payer: Self-pay | Admitting: Cardiology

## 2024-04-21 ENCOUNTER — Ambulatory Visit: Admitting: Cardiology

## 2024-05-11 ENCOUNTER — Ambulatory Visit: Attending: Cardiology | Admitting: Cardiology

## 2024-05-13 ENCOUNTER — Encounter: Payer: Self-pay | Admitting: Cardiology

## 2024-06-30 ENCOUNTER — Ambulatory Visit: Admitting: Cardiology
